# Patient Record
Sex: Female | Born: 1950 | Race: White | Hispanic: No | Marital: Married | Smoking: Never smoker
Health system: Southern US, Community
[De-identification: ages and names within clinical notes are randomized; demographics above are authoritative.]

## PROBLEM LIST (undated history)

## (undated) DIAGNOSIS — F329 Major depressive disorder, single episode, unspecified: Secondary | ICD-10-CM

## (undated) DIAGNOSIS — I10 Essential (primary) hypertension: Secondary | ICD-10-CM

## (undated) DIAGNOSIS — F32A Depression, unspecified: Secondary | ICD-10-CM

## (undated) DIAGNOSIS — F419 Anxiety disorder, unspecified: Secondary | ICD-10-CM

## (undated) HISTORY — DX: Major depressive disorder, single episode, unspecified: F32.9

## (undated) HISTORY — PX: CHOLECYSTECTOMY: SHX55

## (undated) HISTORY — PX: COLONOSCOPY: SHX174

## (undated) HISTORY — PX: LUNG SURGERY: SHX703

## (undated) HISTORY — DX: Essential (primary) hypertension: I10

## (undated) HISTORY — DX: Depression, unspecified: F32.A

## (undated) HISTORY — DX: Anxiety disorder, unspecified: F41.9

---

## 2004-06-01 ENCOUNTER — Other Ambulatory Visit: Payer: Self-pay

## 2005-01-01 ENCOUNTER — Ambulatory Visit: Payer: Self-pay | Admitting: Obstetrics and Gynecology

## 2005-01-28 ENCOUNTER — Ambulatory Visit: Payer: Self-pay | Admitting: Internal Medicine

## 2005-02-05 ENCOUNTER — Ambulatory Visit: Payer: Self-pay | Admitting: Internal Medicine

## 2005-04-07 ENCOUNTER — Ambulatory Visit: Payer: Self-pay | Admitting: Internal Medicine

## 2005-05-08 ENCOUNTER — Emergency Department: Payer: Self-pay | Admitting: Emergency Medicine

## 2005-05-08 ENCOUNTER — Ambulatory Visit: Payer: Self-pay | Admitting: Internal Medicine

## 2005-07-07 ENCOUNTER — Ambulatory Visit: Payer: Self-pay | Admitting: Internal Medicine

## 2005-10-06 ENCOUNTER — Ambulatory Visit: Payer: Self-pay | Admitting: Internal Medicine

## 2005-10-08 ENCOUNTER — Ambulatory Visit: Payer: Self-pay | Admitting: Internal Medicine

## 2005-11-07 ENCOUNTER — Ambulatory Visit: Payer: Self-pay | Admitting: Internal Medicine

## 2006-01-05 ENCOUNTER — Ambulatory Visit: Payer: Self-pay | Admitting: Internal Medicine

## 2006-01-08 ENCOUNTER — Ambulatory Visit: Payer: Self-pay | Admitting: Internal Medicine

## 2006-10-22 ENCOUNTER — Ambulatory Visit: Payer: Self-pay | Admitting: Internal Medicine

## 2006-11-07 ENCOUNTER — Ambulatory Visit: Payer: Self-pay | Admitting: Internal Medicine

## 2006-12-03 ENCOUNTER — Ambulatory Visit: Payer: Self-pay | Admitting: Obstetrics and Gynecology

## 2007-04-20 ENCOUNTER — Ambulatory Visit: Payer: Self-pay | Admitting: Internal Medicine

## 2007-05-09 ENCOUNTER — Ambulatory Visit: Payer: Self-pay | Admitting: Internal Medicine

## 2007-08-22 ENCOUNTER — Emergency Department: Payer: Self-pay | Admitting: Emergency Medicine

## 2007-10-09 ENCOUNTER — Ambulatory Visit: Payer: Self-pay | Admitting: Internal Medicine

## 2007-10-15 ENCOUNTER — Ambulatory Visit: Payer: Self-pay | Admitting: Internal Medicine

## 2007-11-08 ENCOUNTER — Ambulatory Visit: Payer: Self-pay | Admitting: Internal Medicine

## 2008-01-19 ENCOUNTER — Ambulatory Visit: Payer: Self-pay | Admitting: Obstetrics and Gynecology

## 2009-05-17 ENCOUNTER — Ambulatory Visit: Payer: Self-pay | Admitting: Podiatry

## 2010-11-07 ENCOUNTER — Ambulatory Visit: Payer: Self-pay | Admitting: Obstetrics and Gynecology

## 2011-11-11 ENCOUNTER — Ambulatory Visit: Payer: Self-pay | Admitting: Obstetrics and Gynecology

## 2012-05-13 ENCOUNTER — Ambulatory Visit: Payer: Self-pay | Admitting: Gastroenterology

## 2012-12-02 ENCOUNTER — Ambulatory Visit: Payer: Self-pay | Admitting: Obstetrics and Gynecology

## 2013-02-28 DIAGNOSIS — I10 Essential (primary) hypertension: Secondary | ICD-10-CM | POA: Insufficient documentation

## 2013-04-04 DIAGNOSIS — E785 Hyperlipidemia, unspecified: Secondary | ICD-10-CM | POA: Insufficient documentation

## 2013-04-04 DIAGNOSIS — F419 Anxiety disorder, unspecified: Secondary | ICD-10-CM | POA: Insufficient documentation

## 2013-12-06 ENCOUNTER — Ambulatory Visit: Payer: Self-pay | Admitting: Obstetrics and Gynecology

## 2014-09-25 DIAGNOSIS — D75839 Thrombocytosis, unspecified: Secondary | ICD-10-CM | POA: Insufficient documentation

## 2014-09-25 DIAGNOSIS — D473 Essential (hemorrhagic) thrombocythemia: Secondary | ICD-10-CM | POA: Insufficient documentation

## 2015-03-07 ENCOUNTER — Ambulatory Visit
Admit: 2015-03-07 | Disposition: A | Discharge status: Home / Self Care | Payer: Self-pay | Attending: Obstetrics and Gynecology | Admitting: Obstetrics and Gynecology

## 2015-03-21 DIAGNOSIS — M8580 Other specified disorders of bone density and structure, unspecified site: Secondary | ICD-10-CM | POA: Insufficient documentation

## 2015-12-25 ENCOUNTER — Other Ambulatory Visit: Payer: Self-pay | Admitting: Obstetrics and Gynecology

## 2015-12-25 DIAGNOSIS — Z1231 Encounter for screening mammogram for malignant neoplasm of breast: Secondary | ICD-10-CM

## 2016-03-07 ENCOUNTER — Ambulatory Visit
Admission: RE | Admit: 2016-03-07 | Discharge: 2016-03-07 | Disposition: A | Discharge status: Home / Self Care | Payer: 59 | Source: Ambulatory Visit | Attending: Obstetrics and Gynecology | Admitting: Obstetrics and Gynecology

## 2016-03-07 DIAGNOSIS — Z1231 Encounter for screening mammogram for malignant neoplasm of breast: Secondary | ICD-10-CM | POA: Diagnosis present

## 2016-04-01 ENCOUNTER — Inpatient Hospital Stay: Payer: 59

## 2016-04-01 ENCOUNTER — Encounter: Payer: Self-pay | Admitting: Oncology

## 2016-04-01 ENCOUNTER — Inpatient Hospital Stay: Payer: 59 | Attending: Oncology | Admitting: Oncology

## 2016-04-01 VITALS — BP 110/75 | HR 77 | Temp 96.8°F | Resp 20 | Ht 62.0 in | Wt 155.4 lb

## 2016-04-01 DIAGNOSIS — F419 Anxiety disorder, unspecified: Secondary | ICD-10-CM | POA: Insufficient documentation

## 2016-04-01 DIAGNOSIS — D7589 Other specified diseases of blood and blood-forming organs: Secondary | ICD-10-CM | POA: Insufficient documentation

## 2016-04-01 DIAGNOSIS — Z79899 Other long term (current) drug therapy: Secondary | ICD-10-CM | POA: Insufficient documentation

## 2016-04-01 DIAGNOSIS — I10 Essential (primary) hypertension: Secondary | ICD-10-CM | POA: Diagnosis not present

## 2016-04-01 DIAGNOSIS — D75839 Thrombocytosis, unspecified: Secondary | ICD-10-CM

## 2016-04-01 DIAGNOSIS — F329 Major depressive disorder, single episode, unspecified: Secondary | ICD-10-CM | POA: Diagnosis not present

## 2016-04-01 DIAGNOSIS — D473 Essential (hemorrhagic) thrombocythemia: Secondary | ICD-10-CM

## 2016-04-01 LAB — COMPREHENSIVE METABOLIC PANEL
ALT: 17 U/L (ref 14–54)
ANION GAP: 9 (ref 5–15)
AST: 19 U/L (ref 15–41)
Albumin: 4.1 g/dL (ref 3.5–5.0)
Alkaline Phosphatase: 92 U/L (ref 38–126)
BILIRUBIN TOTAL: 0.3 mg/dL (ref 0.3–1.2)
BUN: 16 mg/dL (ref 6–20)
CO2: 28 mmol/L (ref 22–32)
Calcium: 9.4 mg/dL (ref 8.9–10.3)
Chloride: 101 mmol/L (ref 101–111)
Creatinine, Ser: 0.7 mg/dL (ref 0.44–1.00)
Glucose, Bld: 95 mg/dL (ref 65–99)
POTASSIUM: 3.3 mmol/L — AB (ref 3.5–5.1)
Sodium: 138 mmol/L (ref 135–145)
TOTAL PROTEIN: 7.7 g/dL (ref 6.5–8.1)

## 2016-04-01 LAB — IRON AND TIBC
Iron: 48 ug/dL (ref 28–170)
Saturation Ratios: 12 % (ref 10.4–31.8)
TIBC: 399 ug/dL (ref 250–450)
UIBC: 351 ug/dL

## 2016-04-01 LAB — CBC WITH DIFFERENTIAL/PLATELET
Basophils Absolute: 0.1 10*3/uL (ref 0–0.1)
Basophils Relative: 1 %
EOS ABS: 0.1 10*3/uL (ref 0–0.7)
EOS PCT: 1 %
HCT: 40.7 % (ref 35.0–47.0)
Hemoglobin: 14 g/dL (ref 12.0–16.0)
LYMPHS ABS: 2.4 10*3/uL (ref 1.0–3.6)
Lymphocytes Relative: 25 %
MCH: 29 pg (ref 26.0–34.0)
MCHC: 34.5 g/dL (ref 32.0–36.0)
MCV: 84 fL (ref 80.0–100.0)
MONOS PCT: 7 %
Monocytes Absolute: 0.7 10*3/uL (ref 0.2–0.9)
Neutro Abs: 6.2 10*3/uL (ref 1.4–6.5)
Neutrophils Relative %: 66 %
PLATELETS: 487 10*3/uL — AB (ref 150–440)
RBC: 4.85 MIL/uL (ref 3.80–5.20)
RDW: 12.9 % (ref 11.5–14.5)
WBC: 9.5 10*3/uL (ref 3.6–11.0)

## 2016-04-01 LAB — FERRITIN: FERRITIN: 15 ng/mL (ref 11–307)

## 2016-04-01 NOTE — Progress Notes (Signed)
Patient here today for initial evaluation regarding thrombocytosis. Patient denies any concerns today.

## 2016-04-07 LAB — COMP PANEL: LEUKEMIA/LYMPHOMA: IMMUNOPHENOTYPIC PROFILE: 0

## 2016-04-07 LAB — JAK2 EXONS 12-15

## 2016-04-07 NOTE — Progress Notes (Signed)
[place]  [phone] [phone]) [phone]  ID: RAVNEET SPILKER OB: 08-Mar-1951  MR#: [id number]  TKZ#:[id number]  Patient Care Team: Madelyn Brunner, MD as PCP - General (Internal Medicine)  CHIEF COMPLAINT:  Chief Complaint  Patient presents with  . New Evaluation    Thrombocytosis    INTERVAL HISTORY: Patient is a 65 year old female who was noted to have an increasing platelet count on routine blood work. She currently feels well and is asymptomatic. She has no neurologic points. She denies any recent fevers or illnesses. She has a good appetite and denies weight loss. She denies any nausea, vomiting, constipation, or diarrhea. She has no urinary complaints. Patient feels at her baseline and offers no specific complaints today.  REVIEW OF SYSTEMS:   Review of Systems  Constitutional: Negative.  Negative for fever, weight loss and malaise/fatigue.  Respiratory: Negative.  Negative for cough and shortness of breath.   Cardiovascular: Negative.  Negative for chest pain.  Gastrointestinal: Negative.   Genitourinary: Negative.   Musculoskeletal: Negative.   Neurological: Negative.  Negative for weakness.  Psychiatric/Behavioral: Negative.     As per HPI. Otherwise, a complete review of systems is negatve.  PAST MEDICAL HISTORY: Past Medical History  Diagnosis Date  . Depression   . Anxiety   . Hypertension     PAST SURGICAL HISTORY: Past Surgical History  Procedure Laterality Date  . Cholecystectomy    . Lung surgery    . Colonoscopy      FAMILY HISTORY Family History  Problem Relation Age of Onset  . Breast cancer Sister 17  . Cancer Other     Breast  . Cancer Other     Colon cancer       ADVANCED DIRECTIVES:    HEALTH MAINTENANCE: Social History  Substance Use Topics  . Smoking status: Never Smoker   . Smokeless tobacco: Not on file  . Alcohol Use: Not on file     Colonoscopy:  PAP:  Bone density:  Lipid  panel:  No Known Allergies  Current Outpatient Prescriptions  Medication Sig Dispense Refill  . lisinopril-hydrochlorothiazide (PRINZIDE,ZESTORETIC) 10-12.5 MG tablet     . LORazepam (ATIVAN) 0.5 MG tablet Take by mouth.    Marland Kitchen PARoxetine (PAXIL) 40 MG tablet Take by mouth.    . zolpidem (AMBIEN) 10 MG tablet      No current facility-administered medications for this visit.    OBJECTIVE: Filed Vitals:   04/01/16 1551  BP: 110/75  Pulse: 77  Temp: 96.8 F (36 C)  Resp: 20     Body mass index is 28.42 kg/(m^2).    ECOG FS:0 - Asymptomatic  General: Well-developed, well-nourished, no acute distress. Eyes: Pink conjunctiva, anicteric sclera. HEENT: Normocephalic, moist mucous membranes, clear oropharnyx. Lungs: Clear to auscultation bilaterally. Heart: Regular rate and rhythm. No rubs, murmurs, or gallops. Abdomen: Soft, nontender, nondistended. No organomegaly noted, normoactive bowel sounds. Musculoskeletal: No edema, cyanosis, or clubbing. Neuro: Alert, answering all questions appropriately. Cranial nerves grossly intact. Skin: No rashes or petechiae noted. Psych: Normal affect. Lymphatics: No cervical, calvicular, axillary or inguinal LAD.   LAB RESULTS:  Lab Results  Component Value Date   NA 138 04/01/2016   K 3.3* 04/01/2016   CL 101 04/01/2016   CO2 28 04/01/2016   GLUCOSE 95 04/01/2016   BUN 16 04/01/2016   CREATININE 0.70 04/01/2016   CALCIUM 9.4 04/01/2016   PROT 7.7 04/01/2016   ALBUMIN 4.1 04/01/2016   AST 19 04/01/2016  ALT 17 04/01/2016   ALKPHOS 92 04/01/2016   BILITOT 0.3 04/01/2016   GFRNONAA >60 04/01/2016   GFRAA >60 04/01/2016    Lab Results  Component Value Date   WBC 9.5 04/01/2016   NEUTROABS 6.2 04/01/2016   HGB 14.0 04/01/2016   HCT 40.7 04/01/2016   MCV 84.0 04/01/2016   PLT 487* 04/01/2016   Lab Results  Component Value Date   IRON 48 04/01/2016   TIBC 399 04/01/2016   IRONPCTSAT 12 04/01/2016    Lab Results  Component  Value Date   FERRITIN 15 04/01/2016     STUDIES: No results found.  ASSESSMENT: Thrombocytosis.  PLAN:    1. Thrombocytosis: Patient's platelet count is only mildly elevated at 487. JAK-2 mutation and peripheral blood flow cytometry are pending at time of dictation. The remainder of her laboratory work, including iron stores, is either negative or within normal limits. No intervention is needed at this time. Patient does not require bone marrow biopsy. Return to clinic in 3 months with repeat laboratory work and further evaluation.  Approximately 35 minutes was spent in discussion of which greater than 50% was consultation.  Patient expressed understanding and was in agreement with this plan. She also understands that She can call clinic at any time with any questions, concerns, or complaints.    Lloyd Huger, MD   04/07/2016 1:17 PM

## 2016-04-23 ENCOUNTER — Encounter: Payer: Self-pay | Admitting: Oncology

## 2016-04-23 NOTE — Progress Notes (Signed)
This encounter was created in error - please disregard.

## 2016-07-01 ENCOUNTER — Inpatient Hospital Stay: Payer: 59

## 2016-07-01 ENCOUNTER — Inpatient Hospital Stay: Payer: 59 | Admitting: Oncology

## 2017-01-07 ENCOUNTER — Other Ambulatory Visit: Payer: Self-pay | Admitting: Obstetrics and Gynecology

## 2017-01-07 DIAGNOSIS — Z1231 Encounter for screening mammogram for malignant neoplasm of breast: Secondary | ICD-10-CM

## 2017-03-09 ENCOUNTER — Ambulatory Visit
Admission: RE | Admit: 2017-03-09 | Discharge: 2017-03-09 | Disposition: A | Discharge status: Home / Self Care | Payer: 59 | Source: Ambulatory Visit | Attending: Obstetrics and Gynecology | Admitting: Obstetrics and Gynecology

## 2017-03-09 ENCOUNTER — Other Ambulatory Visit: Payer: Self-pay | Admitting: Internal Medicine

## 2017-03-09 DIAGNOSIS — Z1231 Encounter for screening mammogram for malignant neoplasm of breast: Secondary | ICD-10-CM | POA: Insufficient documentation

## 2018-01-14 ENCOUNTER — Other Ambulatory Visit: Payer: Self-pay | Admitting: Obstetrics & Gynecology

## 2018-01-14 DIAGNOSIS — Z1231 Encounter for screening mammogram for malignant neoplasm of breast: Secondary | ICD-10-CM

## 2018-03-11 ENCOUNTER — Ambulatory Visit
Admission: RE | Admit: 2018-03-11 | Discharge: 2018-03-11 | Disposition: A | Discharge status: Home / Self Care | Payer: Managed Care, Other (non HMO) | Source: Ambulatory Visit | Attending: Obstetrics & Gynecology | Admitting: Obstetrics & Gynecology

## 2018-03-11 DIAGNOSIS — Z1231 Encounter for screening mammogram for malignant neoplasm of breast: Secondary | ICD-10-CM | POA: Insufficient documentation

## 2020-03-22 ENCOUNTER — Other Ambulatory Visit: Payer: Self-pay | Admitting: Internal Medicine

## 2020-03-22 DIAGNOSIS — Z1231 Encounter for screening mammogram for malignant neoplasm of breast: Secondary | ICD-10-CM

## 2020-03-27 ENCOUNTER — Ambulatory Visit
Admission: RE | Admit: 2020-03-27 | Discharge: 2020-03-27 | Disposition: A | Discharge status: Home / Self Care | Payer: Medicare HMO | Source: Ambulatory Visit | Attending: Internal Medicine | Admitting: Internal Medicine

## 2020-03-27 DIAGNOSIS — Z1231 Encounter for screening mammogram for malignant neoplasm of breast: Secondary | ICD-10-CM | POA: Diagnosis not present

## 2021-10-02 ENCOUNTER — Other Ambulatory Visit: Payer: Self-pay

## 2021-10-02 ENCOUNTER — Ambulatory Visit (LOCAL_COMMUNITY_HEALTH_CENTER): Payer: Medicare HMO

## 2021-10-02 DIAGNOSIS — Z23 Encounter for immunization: Secondary | ICD-10-CM | POA: Diagnosis not present

## 2021-10-02 DIAGNOSIS — Z719 Counseling, unspecified: Secondary | ICD-10-CM

## 2021-10-02 NOTE — Progress Notes (Signed)
Influenza immunization given and tolerated well. [facility] updated. Baldwin Crown, LPN

## 2021-10-15 ENCOUNTER — Other Ambulatory Visit: Payer: Self-pay | Admitting: Internal Medicine

## 2021-10-15 DIAGNOSIS — Z1231 Encounter for screening mammogram for malignant neoplasm of breast: Secondary | ICD-10-CM

## 2021-12-26 ENCOUNTER — Other Ambulatory Visit: Payer: Self-pay

## 2021-12-26 ENCOUNTER — Ambulatory Visit
Admission: RE | Admit: 2021-12-26 | Discharge: 2021-12-26 | Disposition: A | Discharge status: Home / Self Care | Payer: Medicare HMO | Source: Ambulatory Visit | Attending: Internal Medicine | Admitting: Internal Medicine

## 2021-12-26 DIAGNOSIS — Z1231 Encounter for screening mammogram for malignant neoplasm of breast: Secondary | ICD-10-CM | POA: Insufficient documentation

## 2022-10-07 ENCOUNTER — Ambulatory Visit (LOCAL_COMMUNITY_HEALTH_CENTER): Payer: Medicare HMO

## 2022-10-07 DIAGNOSIS — Z23 Encounter for immunization: Secondary | ICD-10-CM | POA: Diagnosis not present

## 2022-10-07 DIAGNOSIS — Z719 Counseling, unspecified: Secondary | ICD-10-CM

## 2022-10-07 NOTE — Progress Notes (Signed)
Tolerated high dose flu vaccine well today. Josie Saunders, RN

## 2022-10-23 ENCOUNTER — Ambulatory Visit (LOCAL_COMMUNITY_HEALTH_CENTER): Payer: Medicare HMO

## 2022-10-23 DIAGNOSIS — Z719 Counseling, unspecified: Secondary | ICD-10-CM

## 2022-10-23 DIAGNOSIS — Z23 Encounter for immunization: Secondary | ICD-10-CM

## 2022-10-23 NOTE — Progress Notes (Signed)
Client seen in nurse clinic for COVID-19 vaccine.   VIS given.  Screening questions below: Are you feeling sick today? No   Have you ever received a dose of COVID-19 Vaccine? [employer], [place], [place], [place], Other) Yes  If yes, which vaccine and how many doses?    [place]  3   Did you bring the vaccination record card or other documentation?  Yes  Do you have a health condition or are undergoing treatment that makes you moderately or severely immunocompromised? This would include, but not be limited to: cancer, HIV, organ transplant, immunosuppressive therapy/high-dose corticosteroids, or moderate/severe primary immunodeficiency.  No  Have you received COVID-19 vaccine before or during hematopoietic cell transplant (HCT) or CAR-T-cell therapies? No  Have you ever had an allergic reaction to: (This would include a severe allergic reaction or a reaction that caused hives, swelling, or respiratory distress, including wheezing.) A component of a COVID-19 vaccine or a previous dose of COVID-19 vaccine? No   Have you ever had an allergic reaction to another vaccine (other thanCOVID-19 vaccine) or an injectable medication? (This would include a severe allergic reaction or a reaction that caused hives, swelling, or respiratory distress, including wheezing.)   No    Do you have a history of any of the following:  Myocarditis or Pericarditis No  Dermal fillers:  No  Multisystem Inflammatory Syndrome (MIS-C or MIS-A)? No  COVID-19 disease within the past 3 months? No  Vaccinated with monkeypox vaccine in the last 4 weeks? No  Vaccine administered and tolerated well  After vaccine care reviewed.  Kelly King Shelda Pal, RN

## 2023-05-19 ENCOUNTER — Other Ambulatory Visit: Payer: Self-pay | Admitting: Internal Medicine

## 2023-05-19 DIAGNOSIS — Z1231 Encounter for screening mammogram for malignant neoplasm of breast: Secondary | ICD-10-CM

## 2023-05-27 ENCOUNTER — Ambulatory Visit
Admission: RE | Admit: 2023-05-27 | Discharge: 2023-05-27 | Disposition: A | Discharge status: Home / Self Care | Payer: Medicare HMO | Source: Ambulatory Visit | Attending: Internal Medicine | Admitting: Internal Medicine

## 2023-05-27 DIAGNOSIS — Z1231 Encounter for screening mammogram for malignant neoplasm of breast: Secondary | ICD-10-CM | POA: Diagnosis present

## 2023-07-24 ENCOUNTER — Other Ambulatory Visit: Payer: Self-pay

## 2023-07-24 ENCOUNTER — Emergency Department
Admission: EM | Admit: 2023-07-24 | Discharge: 2023-07-24 | Disposition: A | Discharge status: Home / Self Care | Payer: Medicare HMO | Attending: Emergency Medicine | Admitting: Emergency Medicine

## 2023-07-24 DIAGNOSIS — S0093XA Contusion of unspecified part of head, initial encounter: Secondary | ICD-10-CM

## 2023-07-24 DIAGNOSIS — S0990XA Unspecified injury of head, initial encounter: Secondary | ICD-10-CM | POA: Diagnosis present

## 2023-07-24 DIAGNOSIS — Y92010 Kitchen of single-family (private) house as the place of occurrence of the external cause: Secondary | ICD-10-CM | POA: Diagnosis not present

## 2023-07-24 DIAGNOSIS — I10 Essential (primary) hypertension: Secondary | ICD-10-CM | POA: Diagnosis not present

## 2023-07-24 DIAGNOSIS — W228XXA Striking against or struck by other objects, initial encounter: Secondary | ICD-10-CM | POA: Diagnosis not present

## 2023-07-24 NOTE — ED Provider Notes (Signed)
[facility] Provider Note    Event Date/Time   First MD Initiated Contact with Patient 07/24/23 1607     (approximate)   History   Head Injury   HPI  Kelly King is a 72 y.o. female PMH of depression and hypertension who presents for evaluation of a head injury.  Patient states 2 days ago she was walking through her kitchen with the lights off and hit her head on the corner of her fridge.  She denies any loss of consciousness and does not take any blood thinners.  She has had a headache since the injury but has not taken any medication at home.  She denies dizziness, blurry vision and vomiting.     Physical Exam   Triage Vital Signs: ED Triage Vitals [07/24/23 1524]  Encounter Vitals Group     BP (!) 152/82     Systolic BP Percentile      Diastolic BP Percentile      Pulse Rate 74     Resp 16     Temp 98.7 F (37.1 C)     Temp Source Oral     SpO2 96 %     Weight      Height      Head Circumference      Peak Flow      Pain Score 5     Pain Loc      Pain Education      Exclude from Growth Chart     Most recent vital signs: Vitals:   07/24/23 1524  BP: (!) 152/82  Pulse: 74  Resp: 16  Temp: 98.7 F (37.1 C)  SpO2: 96%    General: Awake, no distress.  CV:  Good peripheral perfusion. RRR. Resp:  Normal effort. CTAB. Abd:  No distention.  Other:  PERRL, EOM intact, sensation intact across all dermatomes,  no strength deficits, no gross neuro deficits   ED Results / Procedures / Treatments   Labs (all labs ordered are listed, but only abnormal results are displayed) Labs Reviewed - No data to display   PROCEDURES:  Critical Care performed: No  Procedures   MEDICATIONS ORDERED IN ED: Medications - No data to display   IMPRESSION / MDM / ASSESSMENT AND PLAN / ED COURSE  I reviewed the triage vital signs and the nursing notes.                             72 year old female presents for evaluation of a head  injury.  Differential diagnosis includes, but is not limited to, concussion, headache, intracranial bleed, contusion.  Patient's presentation is most consistent with acute, uncomplicated illness.  Physical exam was reassuring and I did not find any neurodeficits.  Given that she is 2 days out from injury, did not lose consciousness and is not taking any blood thinners and has not had any symptoms other than a headache I do not feel a head CT is necessary at this point.  I gave patient the option for a head CT and she declined.  Since patient has not taken any medication to resolve her headache symptoms, I advised her to try Tylenol and ibuprofen when she gets home.  Patient was given strict return precautions and I advised her come back if she develops any blurry vision, repeated vomiting, dizziness, confusion, weakness.  Patient voiced understanding, all questions were answered and she was stable at discharge.  FINAL CLINICAL IMPRESSION(S) / ED DIAGNOSES   Final diagnoses:  Contusion of head, unspecified part of head, initial encounter     Rx / DC Orders   ED Discharge Orders     None        Note:  This document was prepared using Dragon voice recognition software and may include unintentional dictation errors.   Cameron Ali, PA-C 07/24/23 1713    Chesley Noon, MD 07/24/23 Zollie Pee

## 2023-07-24 NOTE — ED Notes (Signed)
Patient declined discharge vital signs. 

## 2023-07-24 NOTE — Discharge Instructions (Signed)
Please return to the ED if you develop any new or worsening symptoms like blurry vision, vomiting, dizziness, weakness or confusion.  You can take 650 mg of Tylenol and 600 mg of ibuprofen every 6 hours as needed for pain.

## 2023-07-24 NOTE — ED Triage Notes (Signed)
Pt comes with c/o head injury. Pt hit head on refrig two days ago. Pt denies any loc, or thinners. Pt states headache. Pt denies any dizziness

## 2023-08-02 IMAGING — MG MM DIGITAL SCREENING BILAT W/ TOMO AND CAD
6 of 10 series · 6 of 30 positions shown · non-contrast
Comparison: Previous exam(s).

CLINICAL DATA: Screening.

EXAM:
DIGITAL SCREENING BILATERAL MAMMOGRAM WITH TOMOSYNTHESIS AND CAD
TECHNIQUE: Bilateral screening digital craniocaudal and mediolateral oblique
mammograms were obtained. Bilateral screening digital breast
tomosynthesis was performed. The images were evaluated with
computer-aided detection.

[R CC synth-2D (1 of 2)]
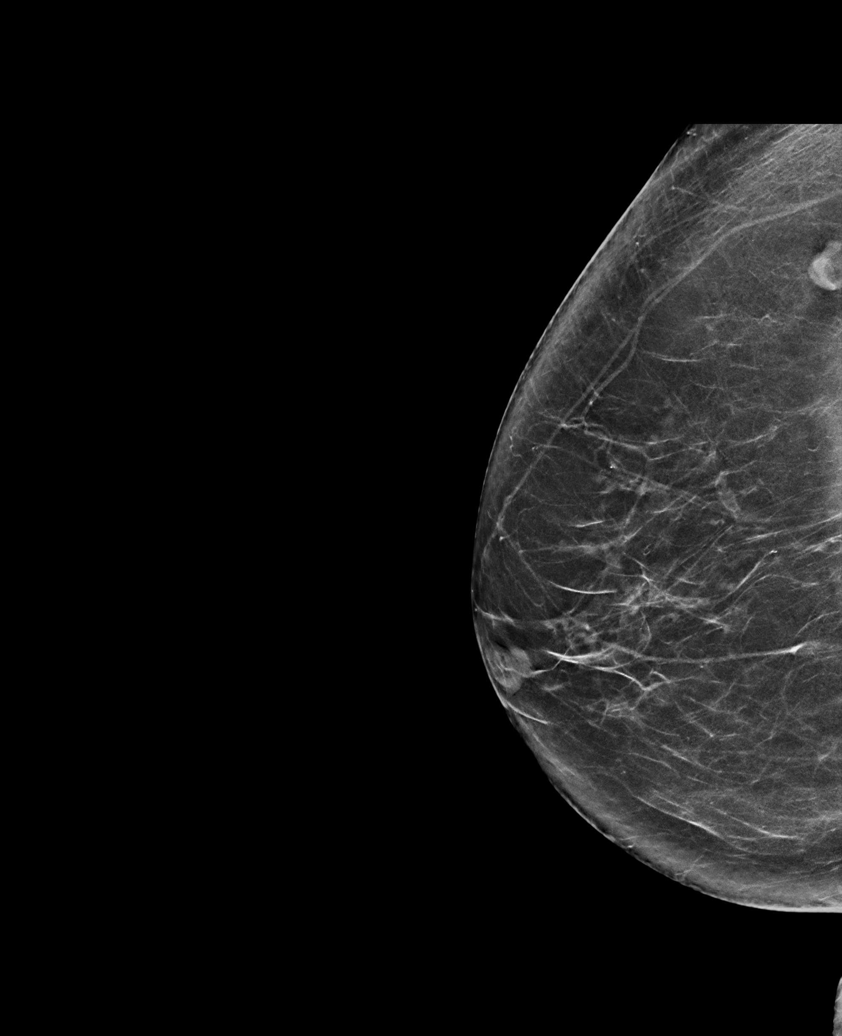

[R MLO synth-2D]
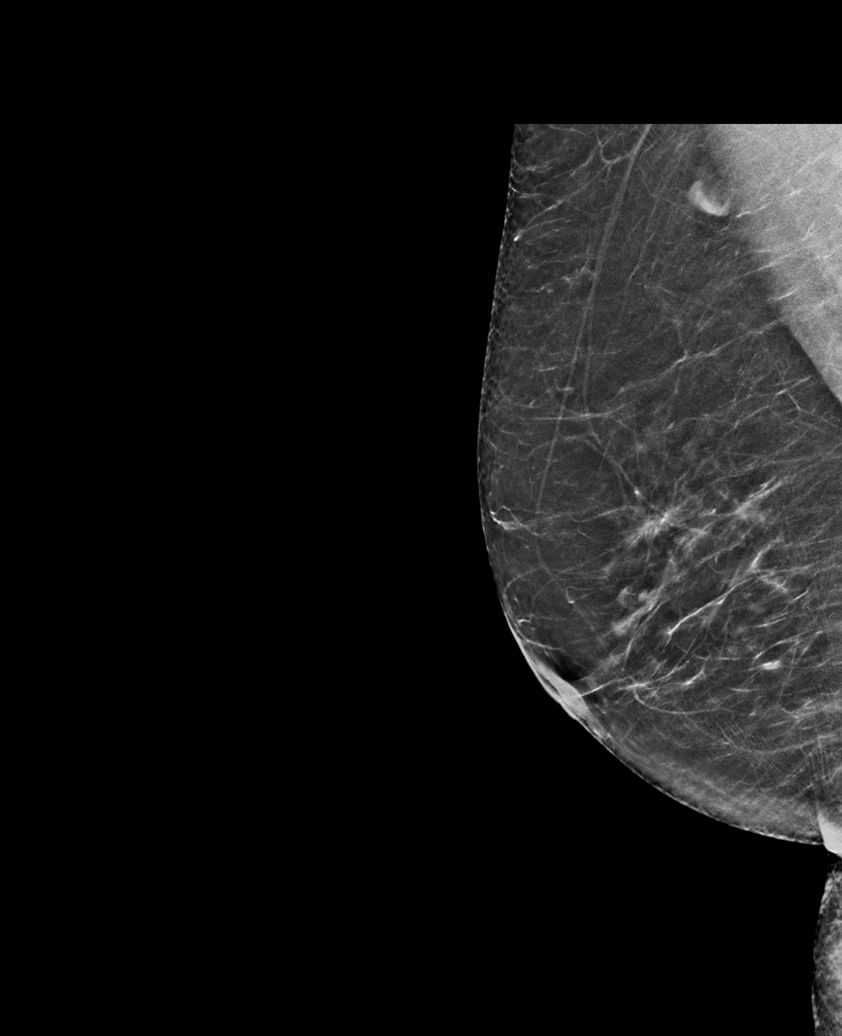

[R CC synth-2D (2 of 2)]
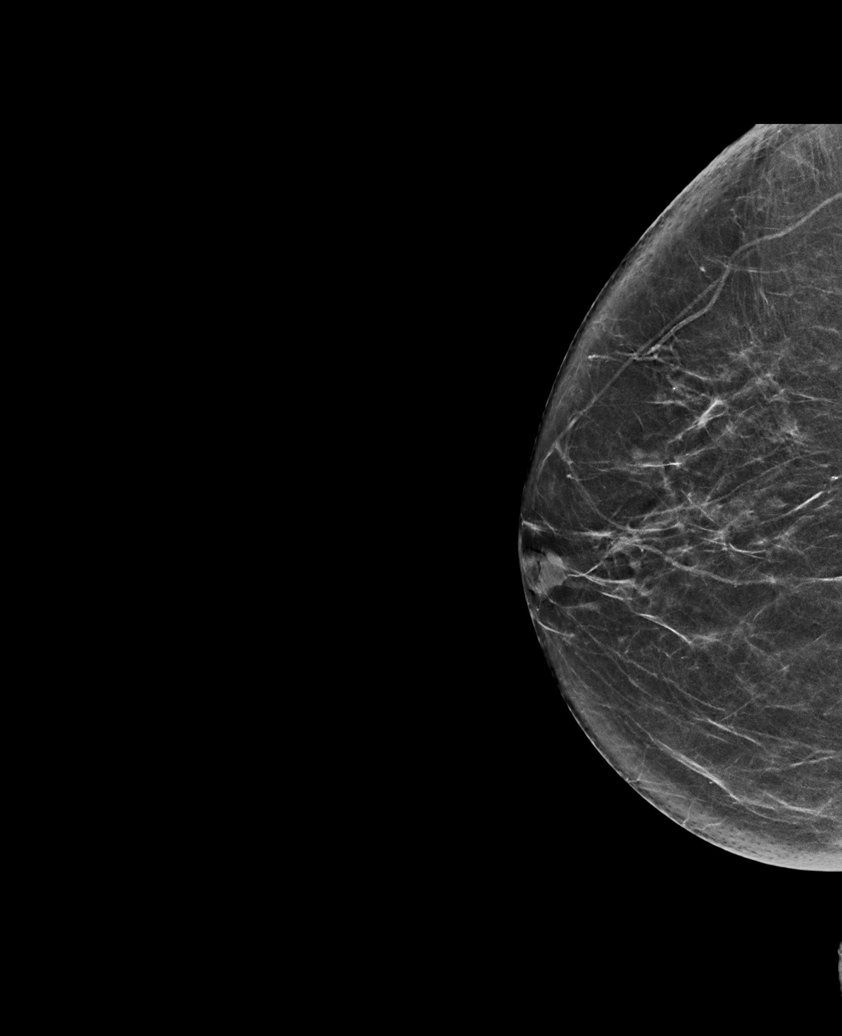

[L MLO synth-2D]
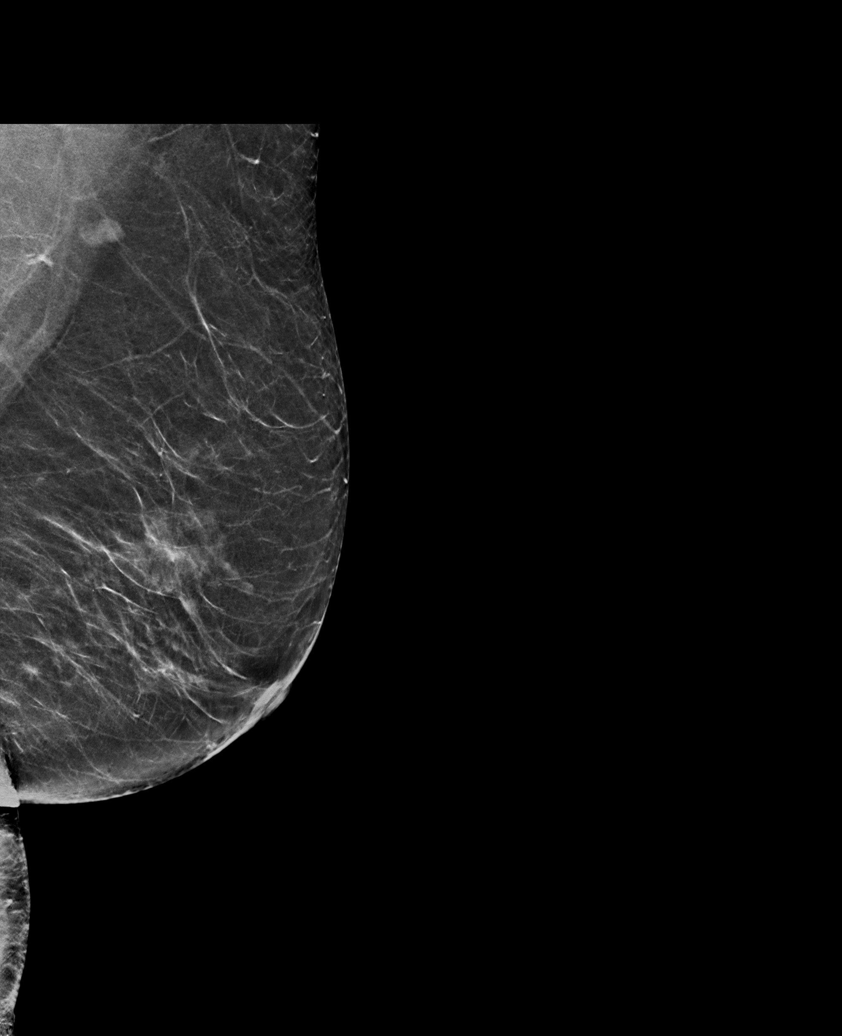

[L CC synth-2D]
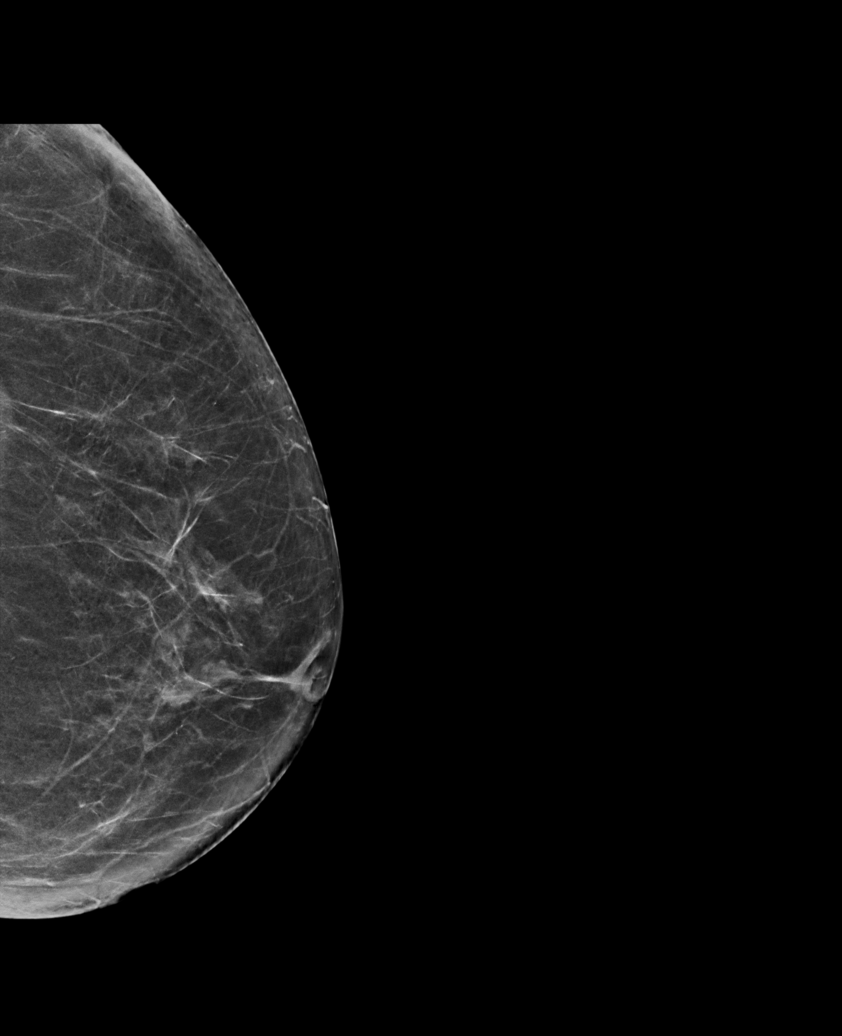

[R MLO tomo · tomo slice 33/64.0]
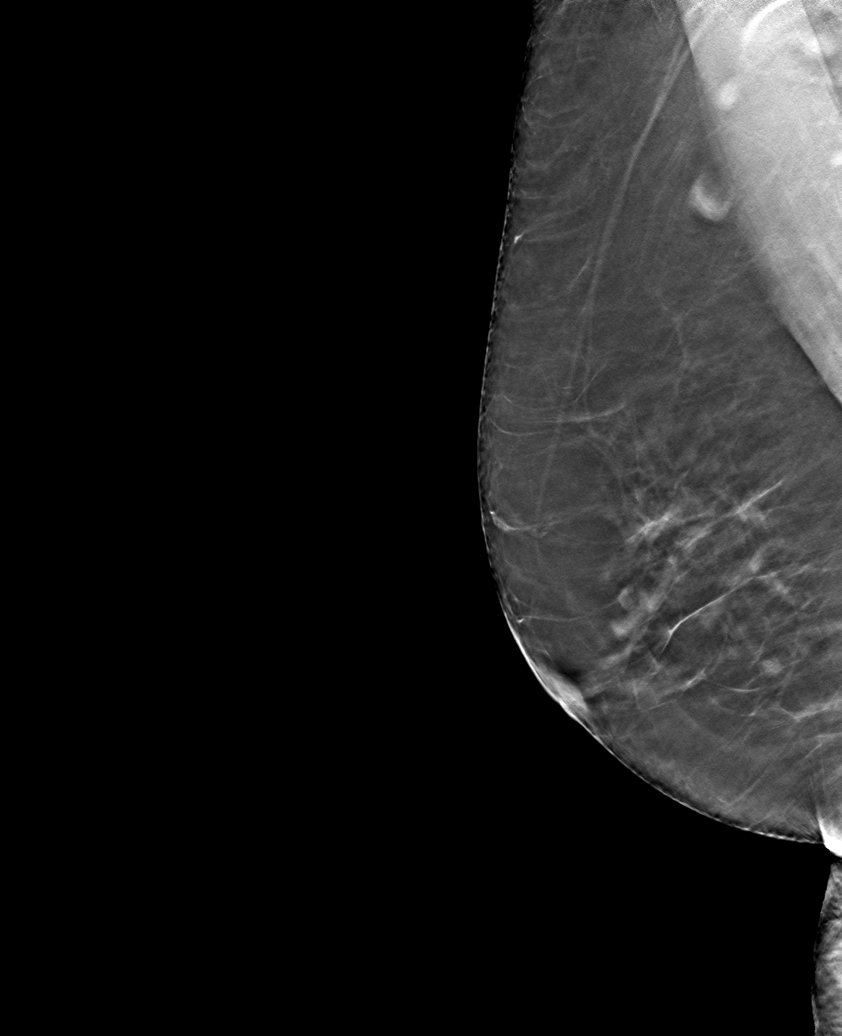

[6 of 30 positions shown; findings below may reference images not displayed]

ACR Breast Density Category b: There are scattered areas of
fibroglandular density.
FINDINGS: There are no findings suspicious for malignancy.
IMPRESSION: No mammographic evidence of malignancy. A result letter of this
screening mammogram will be mailed directly to the patient.

RECOMMENDATION:
Screening mammogram in one year. (Code:51-O-LD2)

BI-RADS CATEGORY  1: Negative.

## 2023-10-29 ENCOUNTER — Ambulatory Visit: Payer: Medicare HMO

## 2023-10-29 DIAGNOSIS — Z719 Counseling, unspecified: Secondary | ICD-10-CM

## 2023-10-29 DIAGNOSIS — Z23 Encounter for immunization: Secondary | ICD-10-CM | POA: Diagnosis not present

## 2023-10-29 NOTE — Progress Notes (Signed)
In nurse clinic for immunizations. Patient requesting Covid Comirnaty 12+ and Flu High-Dose vaccines. Voices no concerns. VIS reviewed and given to patient. Vaccines tolerated well; no issues noted. Patient monitored after vaccines; no problems. [facility] updated and copy given to patient.   Abagail Kitchens, RN

## 2023-11-09 ENCOUNTER — Ambulatory Visit: Payer: Medicare HMO

## 2023-11-09 DIAGNOSIS — Z860101 Personal history of adenomatous and serrated colon polyps: Secondary | ICD-10-CM | POA: Diagnosis not present

## 2023-11-09 DIAGNOSIS — K640 First degree hemorrhoids: Secondary | ICD-10-CM | POA: Diagnosis not present

## 2023-11-09 DIAGNOSIS — D123 Benign neoplasm of transverse colon: Secondary | ICD-10-CM | POA: Diagnosis not present

## 2023-11-09 DIAGNOSIS — D125 Benign neoplasm of sigmoid colon: Secondary | ICD-10-CM | POA: Diagnosis not present

## 2023-11-09 DIAGNOSIS — Z1211 Encounter for screening for malignant neoplasm of colon: Secondary | ICD-10-CM | POA: Diagnosis present

## 2023-11-09 DIAGNOSIS — K5730 Diverticulosis of large intestine without perforation or abscess without bleeding: Secondary | ICD-10-CM | POA: Diagnosis not present

## 2024-07-25 ENCOUNTER — Other Ambulatory Visit: Payer: Self-pay | Admitting: Internal Medicine

## 2024-07-25 DIAGNOSIS — Z1231 Encounter for screening mammogram for malignant neoplasm of breast: Secondary | ICD-10-CM

## 2024-08-11 ENCOUNTER — Ambulatory Visit
Admission: RE | Admit: 2024-08-11 | Discharge: 2024-08-11 | Disposition: A | Discharge status: Home / Self Care | Source: Ambulatory Visit | Attending: Internal Medicine | Admitting: Internal Medicine

## 2024-08-11 DIAGNOSIS — Z1231 Encounter for screening mammogram for malignant neoplasm of breast: Secondary | ICD-10-CM | POA: Insufficient documentation
